# Patient Record
Sex: Male | Born: 2000 | Race: Black or African American | Hispanic: No | Marital: Single | Smoking: Never smoker
Health system: Southern US, Community
[De-identification: ages and names within clinical notes are randomized; demographics above are authoritative.]

---
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
## 2024-01-02 ENCOUNTER — Other Ambulatory Visit: Payer: Self-pay

## 2024-01-02 ENCOUNTER — Emergency Department (HOSPITAL_COMMUNITY)

## 2024-01-02 ENCOUNTER — Emergency Department (HOSPITAL_COMMUNITY)
Admission: EM | Admit: 2024-01-02 | Discharge: 2024-01-02 | Disposition: A | Discharge status: Home / Self Care | Attending: Emergency Medicine | Admitting: Emergency Medicine

## 2024-01-02 ENCOUNTER — Encounter (HOSPITAL_COMMUNITY): Payer: Self-pay

## 2024-01-02 DIAGNOSIS — S01112A Laceration without foreign body of left eyelid and periocular area, initial encounter: Secondary | ICD-10-CM | POA: Insufficient documentation

## 2024-01-02 DIAGNOSIS — S0232XA Fracture of orbital floor, left side, initial encounter for closed fracture: Secondary | ICD-10-CM | POA: Diagnosis not present

## 2024-01-02 DIAGNOSIS — S0285XA Fracture of orbit, unspecified, initial encounter for closed fracture: Secondary | ICD-10-CM

## 2024-01-02 DIAGNOSIS — S0993XA Unspecified injury of face, initial encounter: Secondary | ICD-10-CM | POA: Diagnosis present

## 2024-01-02 DIAGNOSIS — Z23 Encounter for immunization: Secondary | ICD-10-CM | POA: Diagnosis not present

## 2024-01-02 LAB — COMPREHENSIVE METABOLIC PANEL WITH GFR
ALT: 24 U/L (ref 0–44)
AST: 27 U/L (ref 15–41)
Albumin: 4.1 g/dL (ref 3.5–5.0)
Alkaline Phosphatase: 61 U/L (ref 38–126)
Anion gap: 11 (ref 5–15)
BUN: 15 mg/dL (ref 6–20)
CO2: 25 mmol/L (ref 22–32)
Calcium: 9.5 mg/dL (ref 8.9–10.3)
Chloride: 102 mmol/L (ref 98–111)
Creatinine, Ser: 1.02 mg/dL (ref 0.61–1.24)
GFR, Estimated: >60 mL/min (ref >60)
Glucose, Bld: 128 mg/dL — ABNORMAL HIGH (ref 70–99)
Potassium: 4.1 mmol/L (ref 3.5–5.1)
Sodium: 138 mmol/L (ref 135–145)
Total Bilirubin: 2.3 mg/dL — ABNORMAL HIGH (ref 0.0–1.2)
Total Protein: 7.2 g/dL (ref 6.5–8.1)

## 2024-01-02 LAB — CBC WITH DIFFERENTIAL/PLATELET
Abs Immature Granulocytes: 0.1 K/uL — ABNORMAL HIGH (ref 0.00–0.07)
Basophils Absolute: 0.1 K/uL (ref 0.0–0.1)
Basophils Relative: 0 %
Eosinophils Absolute: 0 K/uL (ref 0.0–0.5)
Eosinophils Relative: 0 %
HCT: 43.3 % (ref 39.0–52.0)
Hemoglobin: 16 g/dL (ref 13.0–17.0)
Immature Granulocytes: 1 %
Lymphocytes Relative: 10 %
Lymphs Abs: 1.5 K/uL (ref 0.7–4.0)
MCH: 30.4 pg (ref 26.0–34.0)
MCHC: 37 g/dL — ABNORMAL HIGH (ref 30.0–36.0)
MCV: 82.3 fL (ref 80.0–100.0)
Monocytes Absolute: 1 K/uL (ref 0.1–1.0)
Monocytes Relative: 7 %
Neutro Abs: 12.5 K/uL — ABNORMAL HIGH (ref 1.7–7.7)
Neutrophils Relative %: 82 %
Platelets: 295 K/uL (ref 150–400)
RBC: 5.26 MIL/uL (ref 4.22–5.81)
RDW: 12.2 % (ref 11.5–15.5)
WBC: 15.1 K/uL — ABNORMAL HIGH (ref 4.0–10.5)
nRBC: 0 % (ref 0.0–0.2)

## 2024-01-02 MED ORDER — FENTANYL CITRATE PF 50 MCG/ML IJ SOSY
50.0000 ug | PREFILLED_SYRINGE | Freq: Once | INTRAMUSCULAR | Status: AC
  Administered 2024-01-02: 50 ug via INTRAVENOUS
  Filled 2024-01-02: qty 1

## 2024-01-02 MED ORDER — TETANUS-DIPHTH-ACELL PERTUSSIS 5-2.5-18.5 LF-MCG/0.5 IM SUSY
0.5000 mL | PREFILLED_SYRINGE | Freq: Once | INTRAMUSCULAR | Status: AC
  Administered 2024-01-02: 0.5 mL via INTRAMUSCULAR
  Filled 2024-01-02: qty 0.5

## 2024-01-02 MED ORDER — MORPHINE SULFATE (PF) 4 MG/ML IV SOLN
4.0000 mg | Freq: Once | INTRAVENOUS | Status: AC
  Administered 2024-01-02: 4 mg via INTRAVENOUS
  Filled 2024-01-02: qty 1

## 2024-01-02 MED ORDER — OXYCODONE-ACETAMINOPHEN 7.5-325 MG PO TABS
1.0000 | ORAL_TABLET | Freq: Once | ORAL | Status: DC
  Filled 2024-01-02: qty 1

## 2024-01-02 MED ORDER — TETRACAINE HCL 0.5 % OP SOLN
2.0000 [drp] | Freq: Once | OPHTHALMIC | Status: AC
  Administered 2024-01-02: 2 [drp] via OPHTHALMIC
  Filled 2024-01-02: qty 4

## 2024-01-02 MED ORDER — ONDANSETRON HCL 4 MG/2ML IJ SOLN
4.0000 mg | Freq: Once | INTRAMUSCULAR | Status: AC
  Administered 2024-01-02: 4 mg via INTRAVENOUS
  Filled 2024-01-02: qty 2

## 2024-01-02 MED ORDER — FLUORESCEIN SODIUM 1 MG OP STRP
1.0000 | ORAL_STRIP | Freq: Once | OPHTHALMIC | Status: AC
  Administered 2024-01-02: 1 via OPHTHALMIC
  Filled 2024-01-02: qty 1
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
## 2024-01-02 NOTE — ED Notes (Signed)
 ED Provider at bedside.
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
## 2024-01-03 ENCOUNTER — Telehealth (HOSPITAL_COMMUNITY): Payer: Self-pay

## 2024-01-03 ENCOUNTER — Telehealth: Payer: Self-pay | Admitting: Surgery

## 2024-01-03 MED ORDER — OXYCODONE-ACETAMINOPHEN 5-325 MG PO TABS: 1.0000 | ORAL_TABLET | Freq: Four times a day (QID) | ORAL | 0 refills | Status: AC | PRN

## 2024-01-03 NOTE — Telephone Encounter (Signed)
 Patient and parents called regarding a pain medication prescription they believed was supposed to be sent to their pharmacy, [facility] on [address] in [place]. Upon review of the medical record, no discharge prescription was noted in the chart. The matter has been referred to the EDP on duty for further review.

## 2024-01-03 NOTE — Telephone Encounter (Cosign Needed)
 Patient called requesting pain medication.  The plan yesterday was for him to follow-up with his ophthalmologist this morning and get pain medication if needed from his appointment.  I am unsure if he went to this appointment.  I do not see any prescriptions for pain medications.  I placed a short 3-day supply of Percocet for breakthrough pain.
